# Patient Record
Sex: Male | Born: 1940 | Race: White | Hispanic: No | Marital: Single
Health system: Southern US, Community
[De-identification: ages and names within clinical notes are randomized; demographics above are authoritative.]

---

## 2008-02-08 ENCOUNTER — Ambulatory Visit: Payer: Self-pay | Admitting: Family Medicine

## 2011-12-10 ENCOUNTER — Inpatient Hospital Stay: Payer: Self-pay | Admitting: Internal Medicine

## 2011-12-10 LAB — CBC WITH DIFFERENTIAL/PLATELET
Basophil %: 0.7 %
Eosinophil #: 0.1 10*3/uL (ref 0.0–0.7)
Eosinophil %: 0.8 %
HCT: 33.8 % — ABNORMAL LOW (ref 40.0–52.0)
HGB: 11.6 g/dL — ABNORMAL LOW (ref 13.0–18.0)
Lymphocyte #: 1.4 10*3/uL (ref 1.0–3.6)
Lymphocyte %: 10.9 %
MCHC: 34.4 g/dL (ref 32.0–36.0)
MCV: 82 fL (ref 80–100)
Monocyte %: 9.7 %
Neutrophil #: 9.8 10*3/uL — ABNORMAL HIGH (ref 1.4–6.5)
Neutrophil %: 77.9 %

## 2011-12-10 LAB — COMPREHENSIVE METABOLIC PANEL
Alkaline Phosphatase: 75 U/L (ref 50–136)
BUN: 13 mg/dL (ref 7–18)
Bilirubin,Total: 0.5 mg/dL (ref 0.2–1.0)
Calcium, Total: 9.3 mg/dL (ref 8.5–10.1)
Creatinine: 0.97 mg/dL (ref 0.60–1.30)
SGPT (ALT): 12 U/L
Total Protein: 9.2 g/dL — ABNORMAL HIGH (ref 6.4–8.2)

## 2011-12-10 LAB — SEDIMENTATION RATE: Erythrocyte Sed Rate: 87 mm/hr — ABNORMAL HIGH (ref 0–20)

## 2011-12-10 LAB — PROTIME-INR
INR: 1
Prothrombin Time: 14 secs (ref 11.5–14.7)

## 2011-12-10 LAB — APTT: Activated PTT: 32 secs (ref 23.6–35.9)

## 2011-12-11 LAB — CBC WITH DIFFERENTIAL/PLATELET
Eosinophil %: 3.5 %
MCHC: 34.6 g/dL (ref 32.0–36.0)
Monocyte %: 11.4 %
Neutrophil #: 6 10*3/uL (ref 1.4–6.5)
Neutrophil %: 65 %
Platelet: 454 10*3/uL — ABNORMAL HIGH (ref 150–440)
RBC: 3.71 10*6/uL — ABNORMAL LOW (ref 4.40–5.90)
RDW: 12.8 % (ref 11.5–14.5)
WBC: 9.3 10*3/uL (ref 3.8–10.6)

## 2011-12-11 LAB — COMPREHENSIVE METABOLIC PANEL
Albumin: 2.4 g/dL — ABNORMAL LOW (ref 3.4–5.0)
Alkaline Phosphatase: 59 U/L (ref 50–136)
BUN: 9 mg/dL (ref 7–18)
Bilirubin,Total: 0.5 mg/dL (ref 0.2–1.0)
Calcium, Total: 8.6 mg/dL (ref 8.5–10.1)
Chloride: 103 mmol/L (ref 98–107)
Co2: 29 mmol/L (ref 21–32)
Creatinine: 0.91 mg/dL (ref 0.60–1.30)
EGFR (Non-African Amer.): >60
Osmolality: 275 (ref 275–301)
Potassium: 4 mmol/L (ref 3.5–5.1)
SGPT (ALT): 9 U/L — ABNORMAL LOW
Sodium: 139 mmol/L (ref 136–145)
Total Protein: 7.5 g/dL (ref 6.4–8.2)

## 2011-12-12 LAB — VANCOMYCIN, TROUGH: Vancomycin, Trough: 9 ug/mL — ABNORMAL LOW (ref 10–20)

## 2011-12-12 LAB — PATHOLOGY REPORT

## 2011-12-14 LAB — CBC WITH DIFFERENTIAL/PLATELET
Basophil #: 0.3 10*3/uL — ABNORMAL HIGH (ref 0.0–0.1)
Basophil %: 3.7 %
Eosinophil #: 0.4 10*3/uL (ref 0.0–0.7)
HGB: 11.6 g/dL — ABNORMAL LOW (ref 13.0–18.0)
MCH: 28.2 pg (ref 26.0–34.0)
MCHC: 34.7 g/dL (ref 32.0–36.0)
Monocyte #: 0.8 x10 3/mm (ref 0.2–1.0)
Monocyte %: 8.5 %
Neutrophil %: 65.7 %
Platelet: 562 10*3/uL — ABNORMAL HIGH (ref 150–440)
RBC: 4.12 10*6/uL — ABNORMAL LOW (ref 4.40–5.90)
WBC: 9.2 10*3/uL (ref 3.8–10.6)

## 2011-12-14 LAB — VANCOMYCIN, TROUGH: Vancomycin, Trough: 14 ug/mL (ref 10–20)

## 2011-12-15 LAB — CREATININE, SERUM
EGFR (African American): >60
EGFR (Non-African Amer.): >60

## 2011-12-16 LAB — CULTURE, BLOOD (SINGLE)

## 2011-12-17 LAB — WOUND CULTURE

## 2012-12-01 IMAGING — CR RIGHT FOOT COMPLETE - 3+ VIEW
1 series · 4 of 4 positions shown · non-contrast
Comparison: none

REASON FOR EXAM: infection
COMMENTS:

[Series 1: x foot ap right · 0.14mm/px · 4 of 4 slices shown]
[im 1/4]
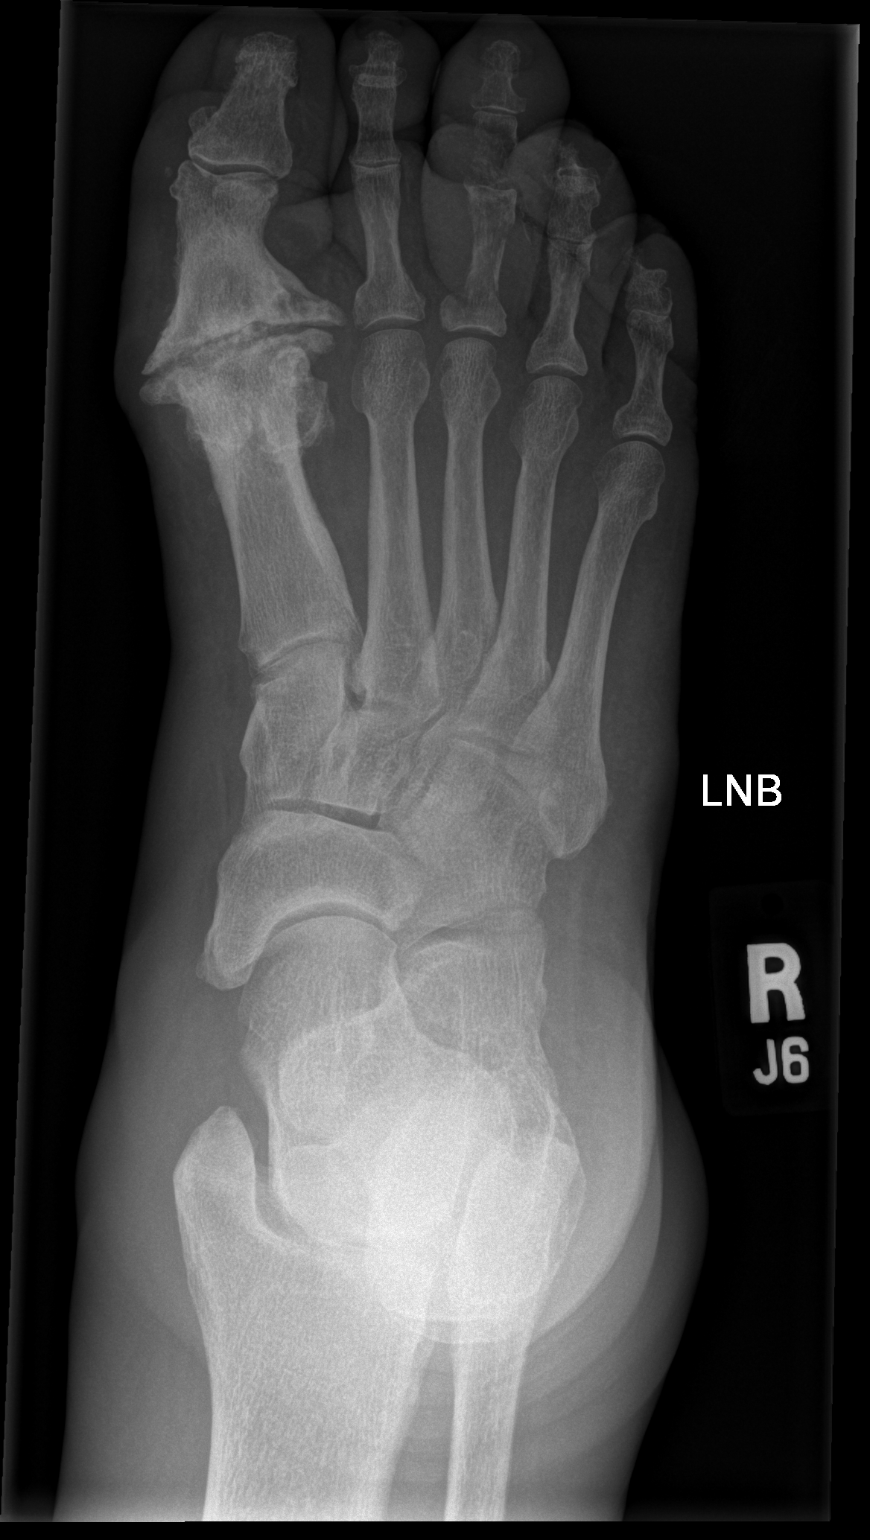
[im 2/4]
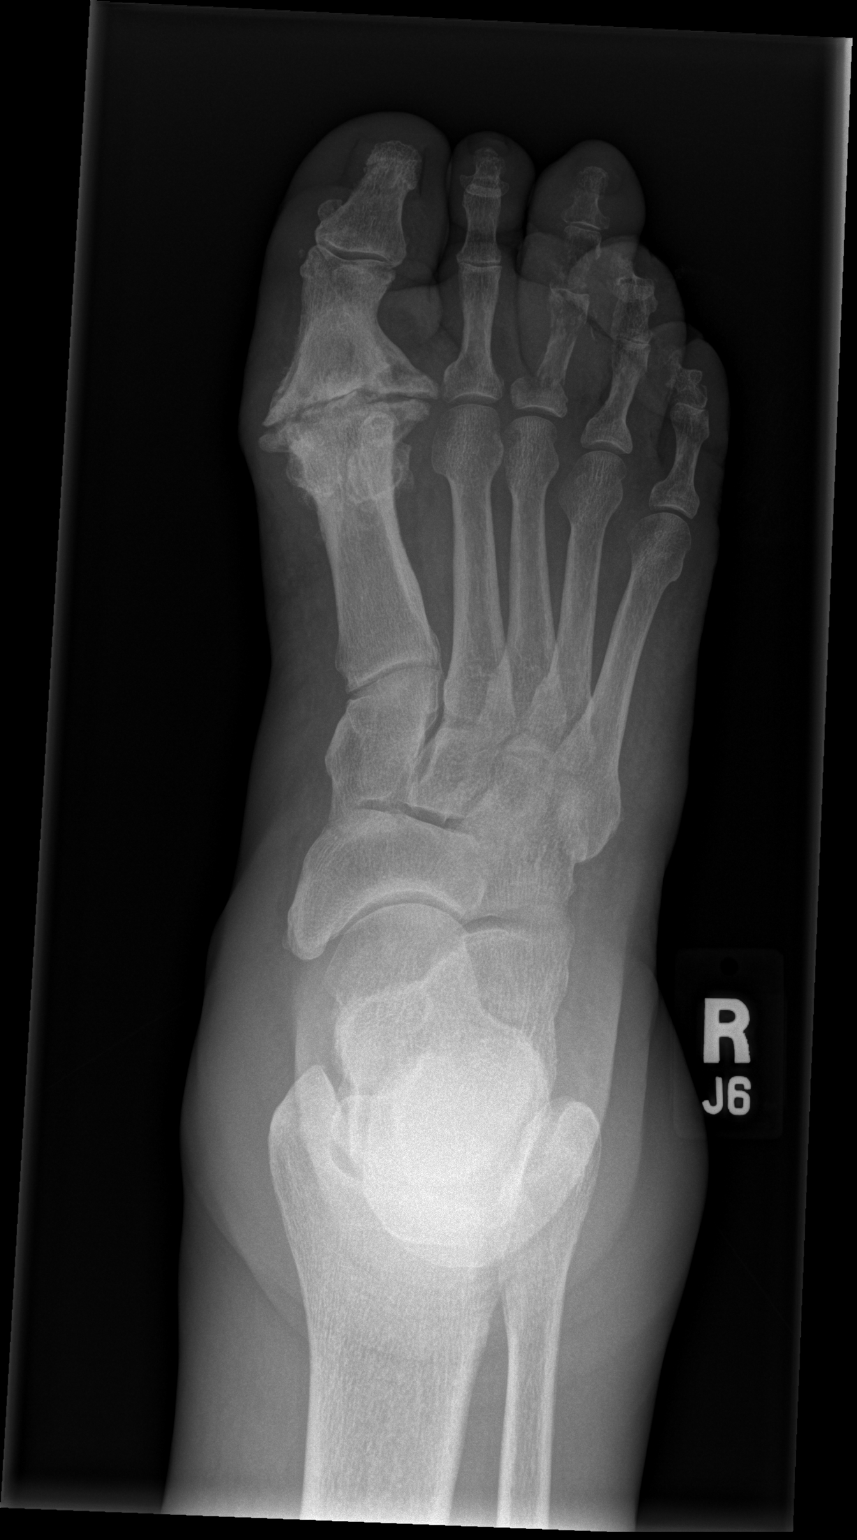
[im 3/4]
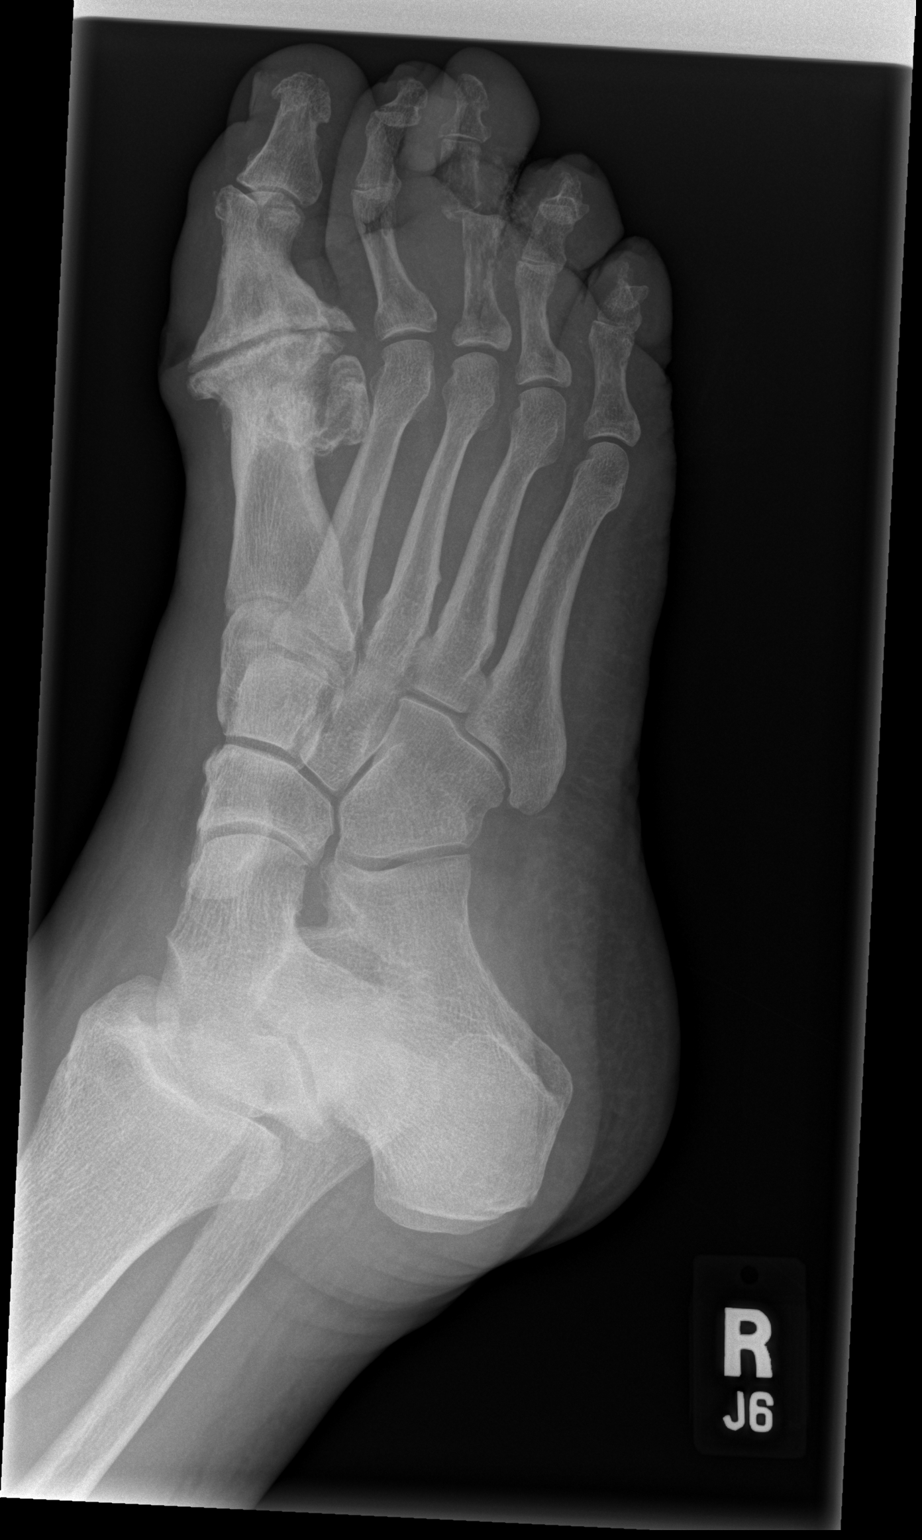
[im 4/4]
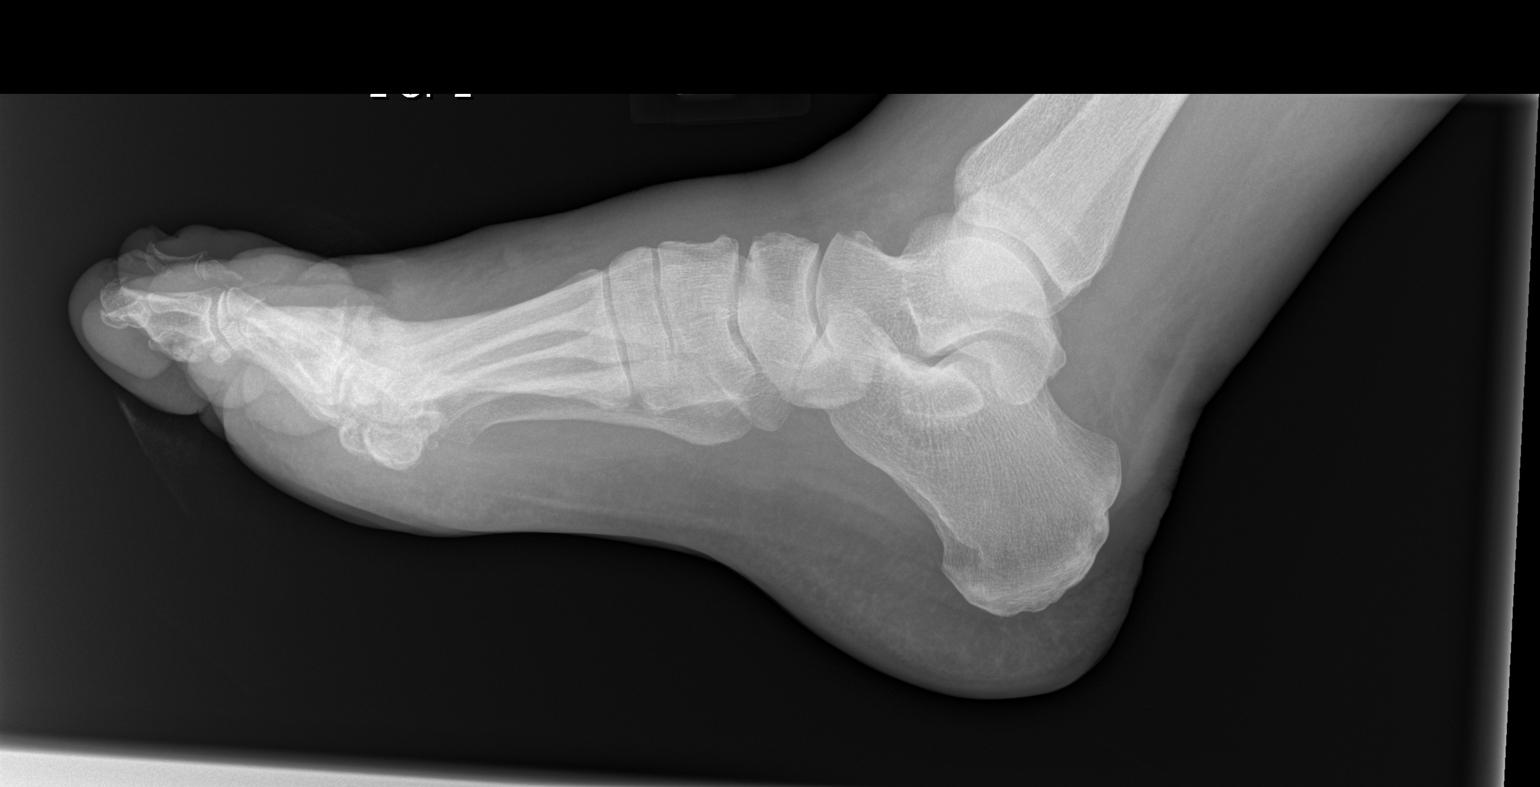

[4 of 4 positions shown; findings below may reference images not displayed]

PROCEDURE:     DXR - DXR FOOT RT COMPLETE W/OBLIQUES  - December 10, 2011 [DATE]

RESULT:     There are no previous studies for comparison.

Four views of the right foot are submitted. There are extensive chronic
changes at the first metatarsophalangeal joint with expansile remodeling of
the joint space and flattening of the head of the first metatarsal. The
interphalangeal joint appears normal. No soft tissue gas is demonstrated.
The second through fifth metatarsals are intact.

There is an abnormal appearance of the proximal phalanx and middle phalanges
of the third toe and of the middle phalanx of the fourth toe. A permeative
pattern is seen here and there is likely a pathologic fracture involving the
base of the proximal phalanx of the third to. There is overlying soft tissue
swelling of the third and fourth toes. The bones of the hindfoot exhibit no
acute abnormality.
IMPRESSION: 1. There is abnormal appearance of the first metatarsophalangeal joint which
may be posttraumatic with degenerative change but certainly chronic
infection cannot be excluded. There is no soft tissue gas.
2. There is likely a pathologic fracture superimposed upon a permeative
pattern of the  proximal phalanx of the third toe. Osteopenia of the middle
phalanx of the third toe and of the middle phalanx of the fourth toe
present. These findings could certainly be related to osteomyelitis.

[REDACTED]

## 2012-12-01 IMAGING — US US EXTREM LOW VENOUS*R*
1 series · 14 of 24 positions shown · non-contrast
Comparison: none

REASON FOR EXAM: swelling
COMMENTS:

PROCEDURE:     US  - US DOPPLER LOW EXTR RIGHT  - December 10, 2011  [DATE]
RESULT:     The right femoral and popliteal veins are normally compressible.
The waveform patterns are normal and the color flow images are normal. The
response to the augmentation and Valsalva maneuvers is normal.

[Series 1: us extrem low venous*right* · 0.10mm/px · 14 of 28 slices shown]
[im 1/28]
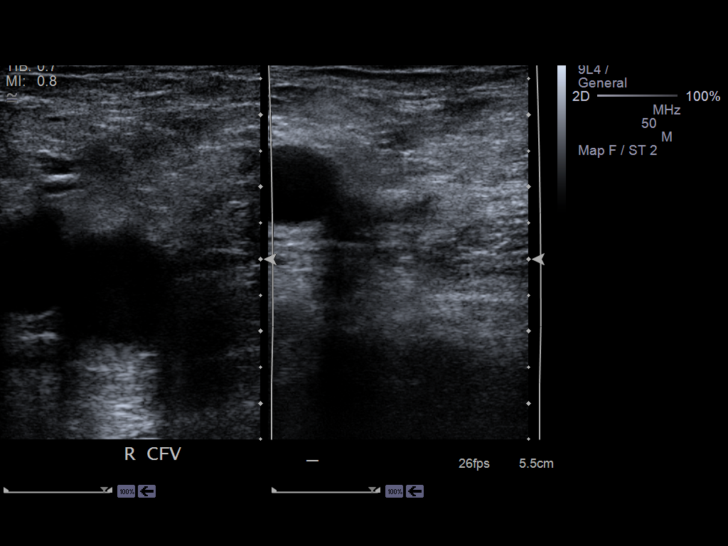
[im 3/28]
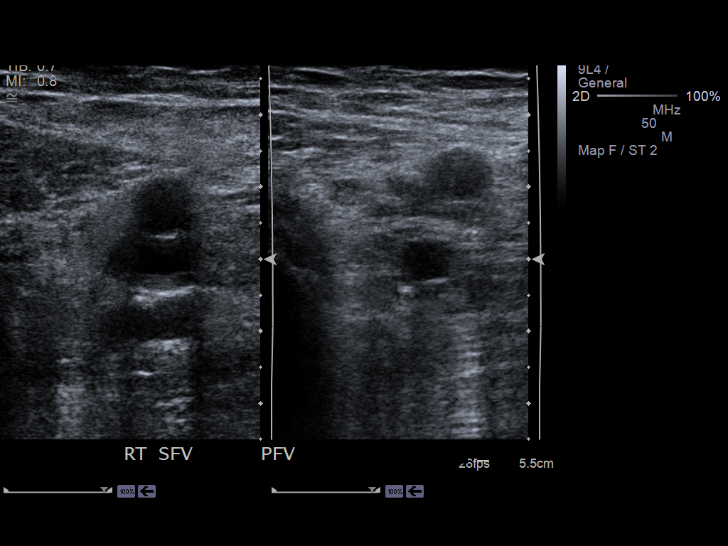
[im 5/28]
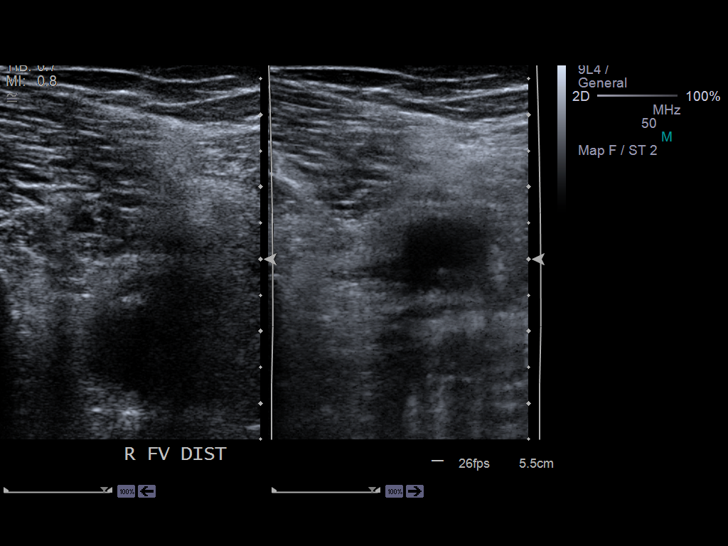
[im 8/28]
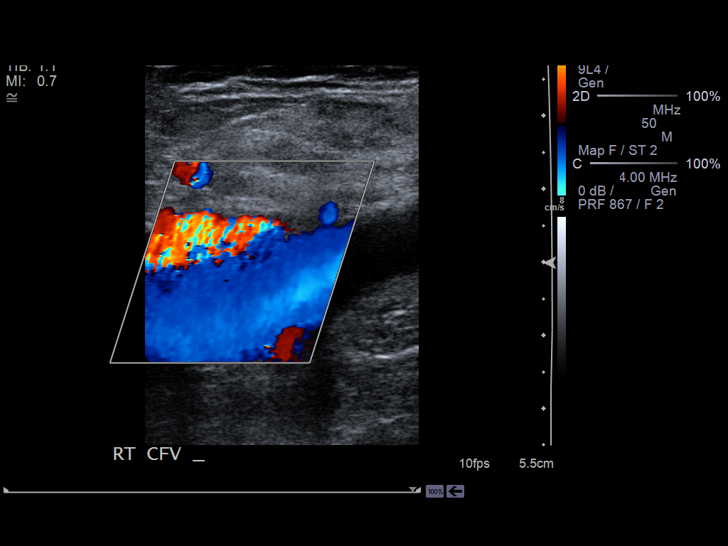
[im 9/28]
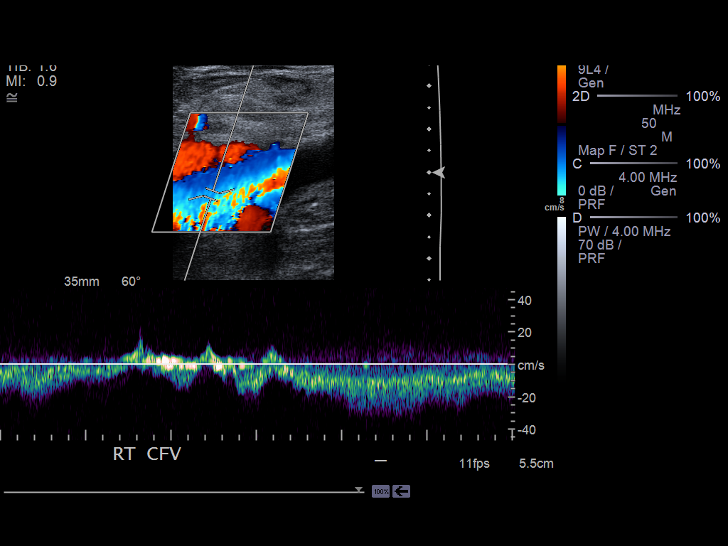
[im 11/28]
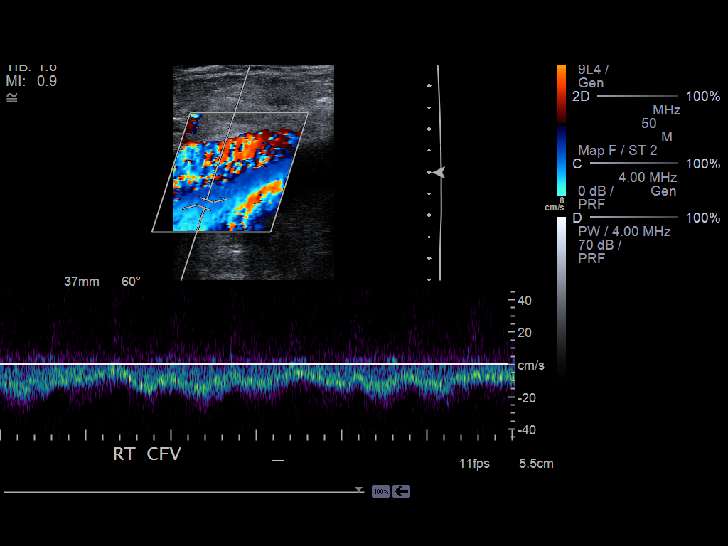
[im 13/28]
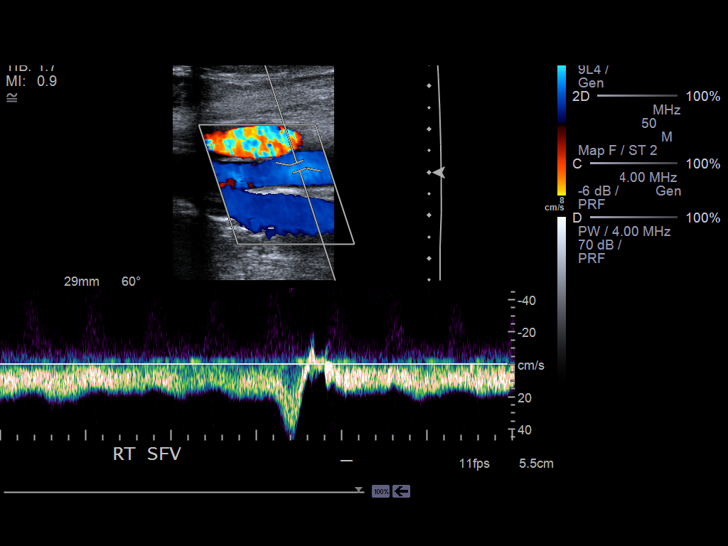
[im 15/28]
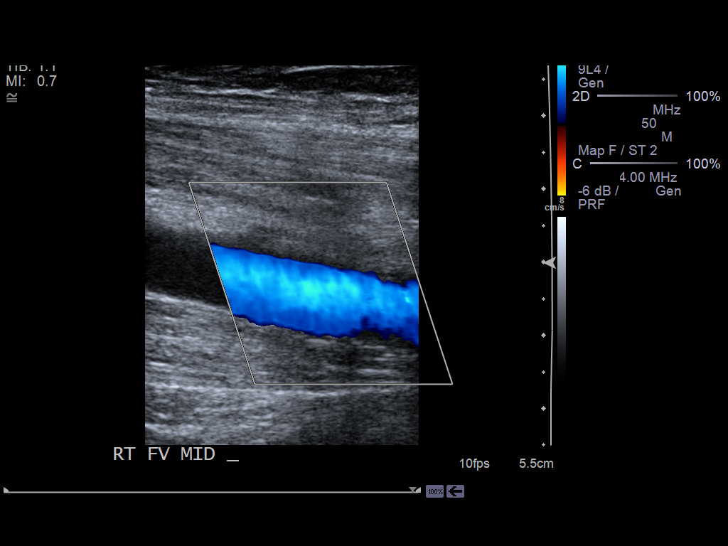
[im 17/28]
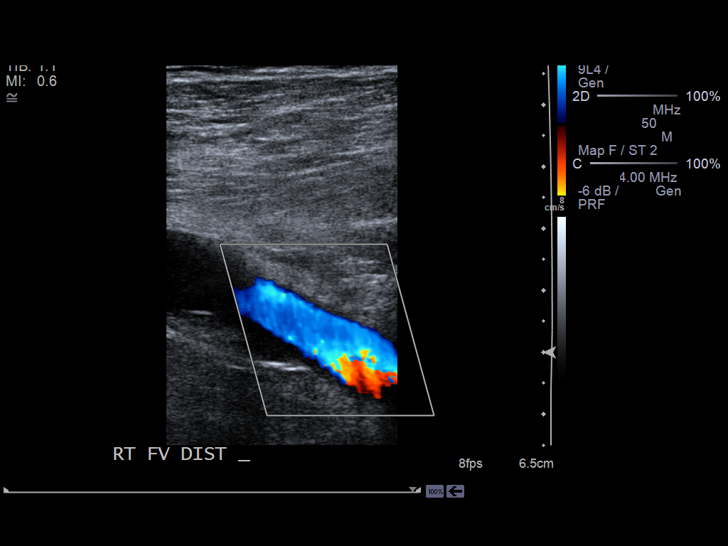
[im 19/28]
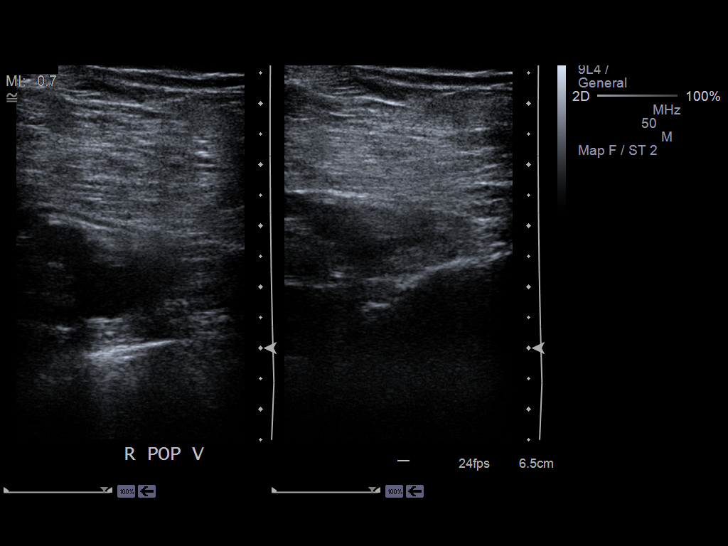
[im 22/28]
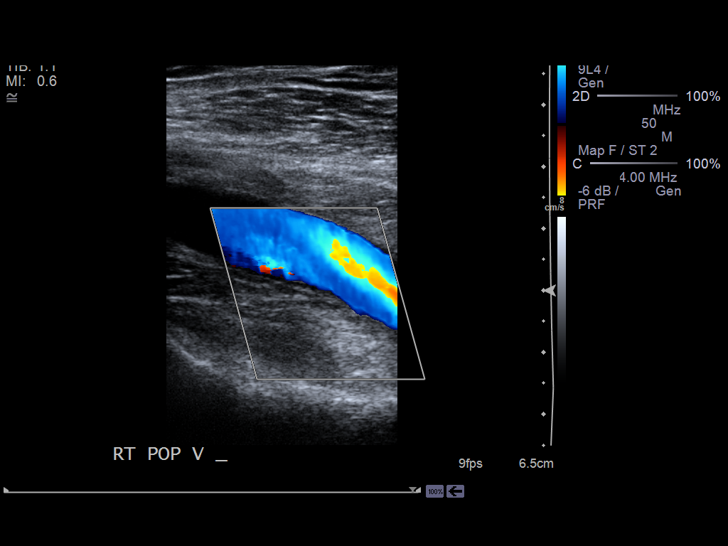
[im 23/28]
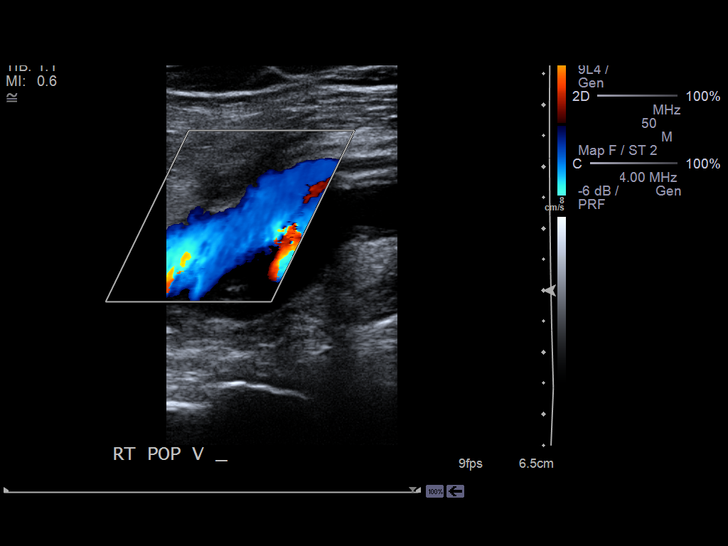
[im 25/28]
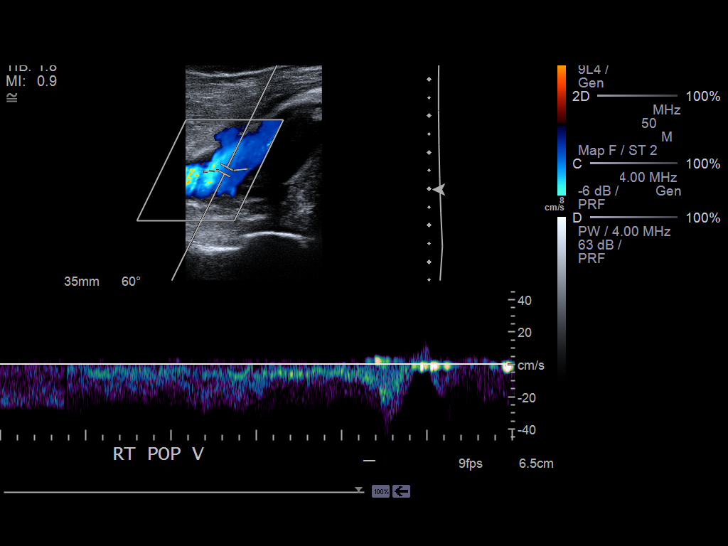
[im 28/28]
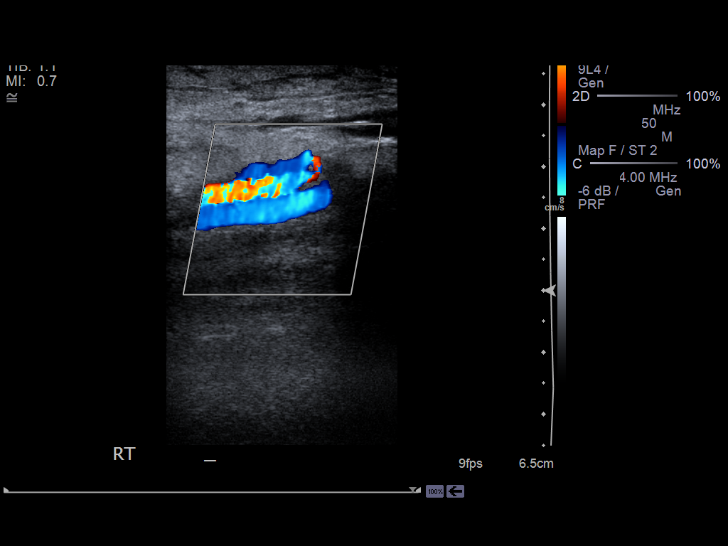

[14 of 24 positions shown; findings below may reference images not displayed]

IMPRESSION: There is no evidence of thrombus within the right femoral
or popliteal veins.

[REDACTED]

## 2013-09-14 ENCOUNTER — Ambulatory Visit: Payer: Self-pay | Admitting: Ophthalmology

## 2013-09-21 ENCOUNTER — Ambulatory Visit: Payer: Self-pay | Admitting: Ophthalmology

## 2013-10-12 ENCOUNTER — Ambulatory Visit: Payer: Self-pay | Admitting: Ophthalmology

## 2014-08-30 NOTE — Op Note (Signed)
PATIENT NAME:  Stephen Kim, Stephen Kim MR#:  [id number] DATE OF BIRTH:  07/31/1940  DATE OF PROCEDURE:  12/10/2011  PREOPERATIVE DIAGNOSIS:  1. Osteomyelitis, right third toe.  2. Ulceration, right first metatarsal and right fifth toe.  POSTOPERATIVE DIAGNOSIS:  1. Osteomyelitis, right third toe.  2. Ulceration, right first metatarsal and right fifth toe.  SURGEON: Ricci Barkerodd W. Miata Culbreth, DPM   ANESTHESIA: General.   HEMOSTASIS: Pneumatic tourniquet right ankle, 250 mmHg.   ESTIMATED BLOOD LOSS: Minimal.   PATHOLOGY: Right third toe.   DRAINS: A 4 x 4 saline gauze packed within the amputation stump.   MATERIALS: None.   COMPLICATIONS: None apparent.   OPERATIVE INDICATIONS: The patient is a 74 year old male with a recent history of ulceration on his third toe which has proceeded to full thickness ulceration with exposed bone. The patient was admitted for IV antibiotics and amputation of the toe as well as debridement of his other ulcerations.   OPERATIVE PROCEDURE: The patient was taken to the Operating Room and placed on the table in the supine position. Following satisfactory general anesthesia, a pneumatic tourniquet was applied at the level of the right ankle, and the right foot was prepped and draped in the usual sterile fashion. The tourniquet was inflated to 250 mmHg.   Attention was then directed to the distal aspect of the right foot where an elliptical incision was made from dorsal to plantar encompassing the base of the third toe. The incision was carried sharply down to the level of the bone and then periosteal dissection carried back to the level of the metatarsophalangeal joint where the toe was disarticulated and removed. There was noted to be some devitalized infected tissues at the base of the wound. This was excisionally debrided using a VersaJet debrider on a setting of seven. Following debridement, the wound appeared to be clean. At this time, there was also a dorsal ulceration  approximately 1.5 cm x 1 cm over the right first metatarsal head full thickness with some surrounding devitalized tissue which was also debrided with the VersaJet, excisionally removing devitalized tissue. A smaller more superficial ulceration along the medial aspect of the right fifth toe was also debrided at the same time. Following debridements, the amputation site was flushed with 3 liters of pulsed irrigation lavage. The incision was then partially closed both dorsal and plantar using 4-0 nylon simple interrupted sutures with 1 vertical mattress suture. The central portion in the web space was left open to drain. Packing was applied, sterile saline gauze deep within the wound. Wet-to-dry dressing also was applied to the other two ulcerations, which were debrided. The tourniquet was released and blood flow noted to return to the right foot and the digits. Then 4 x 4's, Kerlix, and a sterile bandage was applied, followed by an Ace wrap. The patient tolerated the procedure and anesthesia well and was transported to the Postanesthesia Care Unit with vital signs stable and in good condition.   ____________________________ Stephen Kim Caly Pellum, DPM tc:cbb D: 12/19/2011 14:58:00 ET T: 12/19/2011 17:21:06 ET JOB#: [id number]  cc: Stephen Kim Stephen Kim, DPM, <Dictator> Stephen Kim DPM ELECTRONICALLY SIGNED 01/07/2012 12:36

## 2014-08-30 NOTE — Discharge Summary (Signed)
PATIENT NAME:  Stephen Kim, Stephen Kim MR#:  [id number] DATE OF BIRTH:  07/16/1940  DATE OF ADMISSION:  12/10/2011 DATE OF DISCHARGE:  12/15/2011  ADDENDUM:   A late culture report came back on [name]'s wound showing it was growing enterococcus sensitive to ampicillin. The patient's discharge medication is being changed from Cipro to Augmentin twice a day.     ____________________________ Letta PateJohn B. Danne HarborWalker III, MD jbw:bjt D: 12/15/2011 10:31:00 ET T: 12/15/2011 10:59:19 ET JOB#: [id number]  cc: Jonny RuizJohn B. Danne HarborWalker III, MD, <Dictator> Elmo PuttJOHN B WALKER III MD ELECTRONICALLY SIGNED 12/15/2011 14:56

## 2014-08-30 NOTE — Consult Note (Signed)
PATIENT NAME:  Stephen Kim, Stephen Kim MR#:  [id number] DATE OF BIRTH:  [birth year]  DATE OF CONSULTATION:  12/10/2011  REFERRING PHYSICIAN:   CONSULTING PHYSICIAN:  Linus Galasodd Trysta Showman, DPM  REASON FOR CONSULTATION: [name] is a 74 year old male known to me outpatient with diabetes and neuropathy. He recently noticed some swelling and redness in his right foot. He has had a couple of sores on the toes. Recently he gave up a lawn mowing business. He denies any specific injury to the foot. X-rays were taken outpatient which revealed osteomyelitis in the third toe. There was clearly exposed bone through an ulceration on the side of the third toe as well.   PAST MEDICAL HISTORY:  1. Non-insulin-dependent diabetes.  2. Hyperlipidemia.  3. Pernicious anemia.   PAST SURGICAL HISTORY: He denies any previous surgery.   FAMILY HISTORY: Diabetes.   SOCIAL HISTORY: The patient lives alone. He denies alcohol or tobacco use. 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   REVIEW OF SYSTEMS: The patient does not relate any fever or chills. He does have some redness and swelling in the right leg with some open draining wounds. He does have significant numbness in the feet. He denies any cough or shortness of breath. No stomach pain or heartburn. He denies dysuria. No hearing or vision changes.   PHYSICAL EXAMINATION:   VASCULAR: DP and PT pulses are palpable, diminished somewhat on the right, most likely secondary to severe edema. Capillary filling time is intact.   NEUROLOGICAL: Loss of protective threshold with a monofilament wire in the feet. Proprioception is impaired.   INTEGUMENT: Skin is warm, dry, and mildly atrophic. Severe edema in the entire right lower extremity. Erythema from the toes dorsally up to the level of the ankle. Full thickness ulceration on the lateral aspect of the right third toe with clearly exposed bone from the head of the proximal phalanx. There is also an ulceration on the dorsal aspect of  the right first metatarsal head approximately 1.5 cm diameter, which is also full thickness. More superficial ulceration on the medial aspect of the right third toe. Significant cellulitis is noted.   MUSCULOSKELETAL: Limited range of motion in the pedal joints, especially on the right as compared to the left. Severe hammertoe contractures are noted. Clearly exposed bone through the head of the proximal phalanx on the third toe.   X-RAYS: Multiple views of the right foot reveal lytic destructive changes at the middle and proximal phalanx on the right third toe consistent with osteomyelitis. No apparent gas in the tissues.   LAB WORK: Blood work returned with elevated white count of 12.6 with a left shift. Hemoglobin and hematocrit are slightly low at 11.6 and 33.8.   IMPRESSION:  1. Diabetes with neuropathy.  2. Osteomyelitis with full thickness ulceration, right foot.  3. Cellulitis, right foot.   PLAN: The patient is n.p.o. We will plan on amputation of the third toe this evening with debridement of the other ulcerations as well. We did discuss this with the patient including the procedure and recovery time. We will plan on surgery for this evening.  ____________________________ Linus Galasodd Jaia Alonge, DPM tc:slb D: 12/10/2011 13:38:31 ET     T: 12/10/2011 14:25:24 ET        JOB#: [id number] cc: Linus Galasodd Shawntay Prest, DPM, <Dictator> John B. Danne HarborWalker III, MD Aamna Mallozzi DPM ELECTRONICALLY SIGNED 12/19/2011 14:51

## 2014-08-30 NOTE — H&P (Signed)
PATIENT NAME:  BAYAN, KUSHNIR MR#:  [id number] DATE OF BIRTH:  [birth year]  DATE OF ADMISSION:  12/10/2011  PRIMARY CARE PHYSICIAN: Lisette Grinder III, MD  CHIEF COMPLAINT: Left foot and leg swelling, pain, and erythema.   HISTORY OF PRESENT ILLNESS: This is a 74 year old male patient with history of diabetes mellitus who has had left third toe pain, erythema, and swelling over four weeks which has progressively worsened to cause swelling all over his leg below the knee. Today he was in podiatry, Dr. Sammuel Bailiff office, for evaluation and was sent into the hospital for possible osteomyelitis and amputation. The patient does not have any fever, but has significant erythema, pain, and swelling in his leg which is aggravated by walking. He does not have any chills, no bleeding or discharge. He has not been on any outpatient antibiotics, no recent procedures.  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 He does not have chest pain or shortness of breath. No prior surgeries or cardiac evaluation. Today's PT/INR is normal. Leukocytosis is 12.5 and BUN and creatinine are normal. An EKG is pending at this time.   PAST MEDICAL HISTORY: Diabetes mellitus, well controlled.   FAMILY HISTORY: Diabetes mellitus.   SOCIAL HISTORY: The patient does not smoke, drink alcohol, or use illicit drugs. He works by [employer] yards.   CODE STATUS: FULL CODE.   HOME MEDICATIONS:  1. Metformin 1000 mg oral twice a day. 2. Glimepiride 4 mg 1/2 tablet twice a day. 3. Crestor 20 mg oral once a day.  4. Aspirin 81 mg oral once a day.   ALLERGIES: No known drug allergies.   REVIEW OF SYSTEMS: CONSTITUTIONAL: No fever, fatigue. EYES: No blurred vision, pain or redness. ENT: No tinnitus, ear pain, or hearing loss. RESPIRATORY: No cough, wheeze, or hemoptysis. CARDIOVASCULAR: No chest pain or orthopnea. Does have right lower extremity edema with osteomyelitis. GASTROINTESTINAL: No nausea, vomiting, diarrhea,  abdominal pain, or hematemesis. GENITOURINARY: No dysuria, polyuria, or incontinence. ENDOCRINE: No nocturia, thyroid problems, or increased swelling. HEMATOLOGIC/LYMPHATIC: No anemia, easy bruising, or bleeding. SKIN: No rash. Does have edema in his right lower foot and toes. MUSCULOSKELETAL: Has pain in his right foot. No other joint swelling, redness, or effusion. NEUROLOGIC: No numbness, weakness, or dysarthria. PSYCHIATRIC: No anxiety or depression.   PHYSICAL EXAMINATION:   VITAL SIGNS: Temperature 97.3, pulse 86, respirations 18, blood pressure 135/80, and saturating 98% on room air.   GENERAL: Obese Caucasian male patient lying in bed comfortable, conversational, cooperative with exam.   PSYCH: Alert and oriented x3. Mood and affect appropriate. Judgment intact.   HEENT: Atraumatic, normocephalic. Oral mucosa moist and pink. External ears and nose normal. No pallor. No icterus. Pupils bilaterally equal and reactive to light.   NECK: Supple. No thyromegaly. No palpable lymph nodes. Trachea midline. No carotid bruit or JVD.   CARDIOVASCULAR: S1 and S2 regular rate and rhythm without any murmurs.   RESPIRATORY: Normal work of breathing. Clear to auscultation on both sides. Normal to percussion.   GASTROINTESTINAL: Soft abdomen, nontender. Bowel sounds present. No hepatosplenomegaly palpable.   SKIN: Has erythema and dryness over the right foot extending all the way up to his mid leg. No other ulcers noticed.   MUSCULOSKELETAL: No joint swelling, redness, or effusion of the large joints. Normal muscle tone.   NEURO: Motor strength 5/5 in upper and lower extremities. Sensation to fine touch intact all over.   LYMPHATIC: No inguinal or cervical lymphadenopathy.   LABORATORY, DIAGNOSTIC AND RADIOLOGIC  DATA: BUN 13, creatinine 0.97, sodium 135, potassium 3.6, and albumin 3. WBC 12.6, hemoglobin 11.6 with MCV 2, platelets 521, and neutrophils 77%.  INR 1. PTT 32.   Right foot exam shows  abnormal appearance of first metatarsophalangeal joint showing possible chronic infection with findings suggestive of osteomyelitis.   EKG is pending.   ASSESSMENT AND PLAN:  1. Right foot osteomyelitis. Consult podiatry, Dr. Cleda Mccreedy, for amputation. We will check an ESR. We will also get blood cultures and the surgical sample will be sent for culture. The patient will be on vancomycin and Zosyn for broad-spectrum antibiotics. With possible hematogenous spread of the osteomyelitis, will need inpatient therapy with IV antibiotics until we get his culture results back. The patient will likely need a PICC line, but once his cultures are back. 2. Diabetes mellitus. The patient will be on sliding scale insulin. Presently n.p.o. for the surgery. Later will be started on a diabetic diet.  3. Leukocytosis secondary to the osteomyelitis.  4. Thrombocytosis secondary to the osteomyelitis.  5. DVT prophylaxis with Lovenox.            TIME SPENT: Time spent today in coordination of care was 50 minutes with more than 50% of the time spent in coordination of care.  ____________________________ Leia Alf. Kamie Korber, MD srs:slb D: 12/10/2011 12:10:27 ET T: 12/10/2011 12:23:58 ET JOB#: [id number]  cc: Alveta Heimlich R. Micajah Dennin, MD, <Dictator> John B. Sarina Ser, MD Sharlotte Alamo, DPM Neita Carp MD ELECTRONICALLY SIGNED 12/11/2011 14:01

## 2014-08-30 NOTE — Discharge Summary (Signed)
PATIENT NAME:  Stephen Kim, Stephen Kim MR#:  [id number] DATE OF BIRTH:  Jun 07, 1940  DATE OF ADMISSION:  12/10/2011 DATE OF DISCHARGE:  12/15/2011  HISTORY OF PRESENT ILLNESS: Stephen Kim is a 74 year old white gentleman who was seen in podiatry by Dr. Liz Beachodd Klein on the day of admission because of pain in the foot and leg. The patient was found to have what appeared to be gangrene and possibly osteomyelitis of the third toe.   PAST MEDICAL HISTORY: Patient's past medical history was notable only for diabetes.   ALLERGIES: No known drug allergies.   MEDICATIONS ON ADMISSION:  1. Aspirin 81 mg daily.  2. Crestor 20 mg daily.  3. Glimepiride 4 mg 1/2 tablet b.i.d.  4. Metformin 100 mg twice a day   ADMISSION PHYSICAL EXAMINATION: Temperature 97.3, pulse 86, respirations 18, blood pressure 135/80. Admission examination was within normal limits as described by the admitting physician with the exception of erythema of the right foot.   LABORATORY, DIAGNOSTIC AND RADIOLOGIC DATA: Admission CBC showed a hemoglobin of 11.6 with an hematocrit of 33.8. White count was 12,600. Platelet count was 521,000. Sedimentation rate was 87. Admission comprehensive metabolic panel showed a random blood sugar of 158. Total protein was 9.2. Albumin was 3. EKG was within normal limits. Routine radiographs showed an abnormal appearance of the first metatarsal joint which was felt to be post traumatic. There was also noted to be a likely pathological fracture of the proximal phalanx of the third toe. There was considerable osteopenia. Findings were felt to be consistent with osteomyelitis.   HOSPITAL COURSE: The patient was admitted to the regular medical floor where he was operated on that evening by Dr. Liz Beachodd Klein with amputation of the third toe of the right foot. He was seen while in the hospital in consultation by infectious disease. He was treated initially with IV antibiotics. The cultures from the wound eventually grew out  regular staph aureus sensitive to Cipro. Patient's hospital course was relatively unremarkable. He was eventually bridged over to oral Cipro. His dressings were changed daily by Dr. Graciela HusbandsKlein.   DISCHARGE DIAGNOSIS:  1. Amputation of the third toe of the right foot.  2. Probable osteomyelitis.  3. Diabetes.   DISCHARGE MEDICATIONS:  1. Colace 100 mg b.i.d.  2. Tylenol 650 mg every four hours p.r.n.  3. Protonix 40 mg daily.  4. Percocet 5/325, 1 to 2 tablets every six hours as needed.  5. Ambien 5 mg at bedtime.   6. Vitamin B12 1000 mcg daily.  7. Amaryl 2 mg b.i.d.  8. Cipro 500 mg b.i.d. for 14 days.   DIET: Patient is being discharged on a low fat, low cholesterol diet.   ACTIVITY: He is to have activity as tolerated.   DISCHARGE INSTRUCTIONS: He will continue to have physical therapy. He will continue to have dressing changes.   CODE STATUS: Patient is a FULL CODE.   DISPOSITION: It is anticipated that he will eventually return home.   ____________________________ Letta PateJohn B. Danne HarborWalker III, MD jbw:cms D: 12/15/2011 09:59:38 ET T: 12/15/2011 10:36:35 ET JOB#: [id number]  cc: Letta PateJohn B. Danne HarborWalker III, MD, <Dictator> Elmo PuttJOHN B WALKER III MD ELECTRONICALLY SIGNED 12/15/2011 14:56

## 2014-08-30 NOTE — Consult Note (Signed)
PATIENT NAME:  Stephen Kim, Stephen Kim MR#:  [id number] DATE OF BIRTH:  July 26, 1940  DATE OF CONSULTATION:  12/11/2011  REFERRING PHYSICIAN:  Sharlotte Alamo, DPM CONSULTING PHYSICIAN:  Cheral Marker. Ola Spurr, MD  PRIMARY CARE PHYSICIAN: Lisette Grinder, MD   REASON FOR CONSULTATION: Osteomyelitis and diabetes.  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 He apparently did not seek any medical attention for this. He has not taken any antibiotics for it. It had been making it quite difficult for him to ambulate. He'd had no fever or chills with this. He was seen by Dr. Cleda Mccreedy and admitted for surgery and antibiotics for osteomyelitis. At the time of admission apparently his toe had exposed bone distally. Surgery was performed July 30th with evidence of ulcers on the right first metatarsal and fifth toe as well as osteomyelitis of the right third toe. He underwent amputation of the right third toe with excisional debridement of ulcers.   Today the patient reports feeling relatively well. He says the swelling has decreased remarkably.   PAST MEDICAL HISTORY: 1. Diabetes. This is apparently well controlled and he says he saw Dr. Gilford Rile in June.  2. Hyperlipidemia, on a statin. 3. Previous issues with his feet to where he's had some cracked skin, is followed with Dr. Cleda Mccreedy for this. Most recent episode was about a year and a half ago he says.  SOCIAL HISTORY: The patient doesn't smoke, drink alcohol, or use drugs. 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 He lives alone.   FAMILY HISTORY: Positive for diabetes.  REVIEW OF SYSTEMS: 11 systems reviewed and negative except as per HPI.  HOME MEDICATIONS:  1. Metformin 1000 twice a day. 2. Glimepiride 4 mg half a  tablet twice a day. 3. Crestor 20 once a day. 4. Aspirin 81 mg once a day. 5. Since admission he has been on antibiotics with Vancomycin and Zosyn.   ALLERGIES: He has no known drug allergies.  PHYSICAL EXAMINATION:  VITAL SIGNS: The patient is afebrile, 99.3, pulse 83, blood pressure 111/67, respirations 18.  GENERAL: He is a pleasant interactive male who looks younger than his stated age.   HEENT: Pupils are equal, round, and reactive to light and accommodation. Extraocular movements are intact. Sclerae anicteric. Oropharynx clear.  NECK: Supple.  HEART: Regular.  LUNGS: Clear.  ABDOMEN: Soft, nontender, and nondistended. No hepatosplenomegaly.  EXTREMITIES: His left leg is wrapped in a postoperative dressing. There is no associated edema or redness above it.   LABORATORY DATA: White blood count on admission 12 but decreased to 9 since admission and antibiotics. Creatinine 0.9. Of note, he has cultures pending on his toe with Gram stain from the operative cultures revealing rare white blood cells, rare Gram negative rods, and rare positive cocci.   IMPRESSION: This is a 74 year old with relatively well controlled diabetes who has osteomyelitis now status post resection of the infected bone and tissue. He does have an elevated white count and elevated ESR. This has been going on for at least three to four weeks.  RECOMMENDATIONS: 1. Await cultures. 2. Continue Vanc and Zosyn while in house and pending cultures. 3. Now that the resected bone has been removed, he may be able to be transitioned to oral antibiotics depending  on the culture results.  4. Recommend elevation of the foot and continued wound care by Podiatry.   Thank you very much for the consult. We will be glad to follow along with you.  ____________________________ Cheral Marker. Ola Spurr, MD dpf:drc D: 12/11/2011 19:26:33 ET T: 12/12/2011 10:38:27 ET JOB#: [id number]  cc: Cheral Marker. Ola Spurr, MD, <Dictator> Jenesis Martin  Ola Spurr MD ELECTRONICALLY SIGNED 12/17/2011 21:11

## 2014-09-03 NOTE — Op Note (Signed)
PATIENT NAME:  Stephen Kim, Stephen Kim MR#:  [id number] DATE OF BIRTH:  [birth year]  DATE OF PROCEDURE:  09/21/2013  PREOPERATIVE DIAGNOSIS: Visually significant cataract of the right eye.   POSTOPERATIVE DIAGNOSIS: Visually significant cataract of the right eye.   OPERATIVE PROCEDURE: Cataract extraction by phacoemulsification with implant of intraocular lens to right eye.   SURGEON: Galen ManilaWilliam Monterrio Gerst, MD.   ANESTHESIA:  1. Managed anesthesia care.  2. Topical tetracaine drops followed by 2% Xylocaine jelly applied in the preoperative holding area.   COMPLICATIONS: None.   TECHNIQUE:  Stop and chop.  DESCRIPTION OF PROCEDURE: The patient was examined and consented in the preoperative holding area where the aforementioned topical anesthesia was applied to the right eye and then brought back to the Operating Room where the right eye was prepped and draped in the usual sterile ophthalmic fashion and a lid speculum was placed. A paracentesis was created with the side port blade and the anterior chamber was filled with viscoelastic. A near clear corneal incision was performed with the steel keratome. A continuous curvilinear capsulorrhexis was performed with a cystotome followed by the capsulorrhexis forceps. Hydrodissection and hydrodelineation were carried out with BSS on a blunt cannula. The lens was removed in a stop and chop technique and the remaining cortical material was removed with the irrigation-aspiration handpiece. The capsular bag was inflated with viscoelastic and the ZCB00 21.5-diopter lens, serial number [id number] was placed in the capsular bag without complication. The remaining viscoelastic was removed from the eye with the irrigation-aspiration handpiece. The wounds were hydrated. The anterior chamber was flushed with Miostat and the eye was inflated to physiologic pressure. 0.1 mL of cefuroxime concentration 10 mg/mL was placed in the anterior chamber. The wounds were found to be water  tight. The eye was dressed with Vigamox. The patient was given protective glasses to wear throughout the day and a shield with which to sleep tonight. The patient was also given drops with which to begin a drop regimen today and will follow-up with me in one day.      ____________________________ Jerilee FieldWilliam L. Kieran Arreguin, MD wlp:dmm D: 09/21/2013 21:59:43 ET T: 09/21/2013 22:15:43 ET JOB#: [id number]  cc: Reda L. Savhanna Sliva, MD, <Dictator> Jerilee FieldWILLIAM L Filimon Miranda MD ELECTRONICALLY SIGNED 09/23/2013 13:28

## 2014-09-03 NOTE — Op Note (Signed)
PATIENT NAME:  Stephen Kim, Gonzalo MR#:  [id number] DATE OF BIRTH:  07-03-1940  DATE OF PROCEDURE:  10/12/2013  PREOPERATIVE DIAGNOSIS: Visually significant cataract of the left eye.   POSTOPERATIVE DIAGNOSIS: Visually significant cataract of the left eye.   OPERATIVE PROCEDURE: Cataract extraction by phacoemulsification with implant of intraocular lens to right eye.   SURGEON: Galen ManilaWilliam Aleeta Schmaltz, MD.   ANESTHESIA:  1.  Managed anesthesia care.  2.  Topical tetracaine drops followed by 2% Xylocaine jelly applied in the preoperative holding area.   COMPLICATIONS: None.   TECHNIQUE:  Stop and chop.  DESCRIPTION OF PROCEDURE: The patient was examined and consented in the preoperative holding area where the aforementioned topical anesthesia was applied to the left eye and then brought back to the Operating Room where the left eye was prepped and draped in the usual sterile ophthalmic fashion and a lid speculum was placed. A paracentesis was created with the side port blade and the anterior chamber was filled with viscoelastic. A near clear corneal incision was performed with the steel keratome. A continuous curvilinear capsulorrhexis was performed with a cystotome followed by the capsulorrhexis forceps. Hydrodissection and hydrodelineation were carried out with BSS on a blunt cannula. The lens was removed in a stop and chop technique and the remaining cortical material was removed with the irrigation-aspiration handpiece. The capsular bag was inflated with viscoelastic and the [employer] ZCB00 22.5-diopter lens, serial number [id number] was placed in the capsular bag without complication. The remaining viscoelastic was removed from the eye with the irrigation-aspiration handpiece. The wounds were hydrated. The anterior chamber was flushed with Miostat and the eye was inflated to physiologic pressure. 0.1 mL of cefuroxime concentration 10 mg/mL was placed in the anterior chamber. The wounds were found to be water  tight. The eye was dressed with Vigamox. The patient was given protective glasses to wear throughout the day and a shield with which to sleep tonight. The patient was also given drops with which to begin a drop regimen today and will follow-up with me in one day.      ____________________________ Jerilee FieldWilliam L. Lamario Mani, MD wlp:dmm D: 10/12/2013 21:25:50 ET T: 10/12/2013 21:39:59 ET JOB#: [id number]  cc: Densel L. Sidnee Gambrill, MD, <Dictator> Jerilee FieldWILLIAM L Pepper Wyndham MD ELECTRONICALLY SIGNED 10/13/2013 13:44
